# Patient Record
Sex: Male | Born: 2000
Health system: Southern US, Community
[De-identification: ages and names within clinical notes are randomized; demographics above are authoritative.]

## PROBLEM LIST (undated history)

## (undated) DIAGNOSIS — H9190 Unspecified hearing loss, unspecified ear: Secondary | ICD-10-CM

## (undated) DIAGNOSIS — F32A Depression, unspecified: Secondary | ICD-10-CM

## (undated) DIAGNOSIS — F419 Anxiety disorder, unspecified: Secondary | ICD-10-CM

## (undated) DIAGNOSIS — T7840XA Allergy, unspecified, initial encounter: Secondary | ICD-10-CM

## (undated) HISTORY — PX: BRAIN SURGERY: SHX531

## (undated) HISTORY — DX: Depression, unspecified: F32.A

## (undated) HISTORY — PX: INNER EAR SURGERY: SHX679

## (undated) HISTORY — PX: TONSILLECTOMY AND ADENOIDECTOMY: SUR1326

## (undated) HISTORY — DX: Anxiety disorder, unspecified: F41.9

## (undated) HISTORY — DX: Allergy, unspecified, initial encounter: T78.40XA

---
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
## 2022-03-10 ENCOUNTER — Other Ambulatory Visit: Payer: Self-pay | Admitting: Pediatrics

## 2022-03-10 DIAGNOSIS — L210 Seborrhea capitis: Secondary | ICD-10-CM

## 2022-04-24 ENCOUNTER — Telehealth: Payer: Self-pay | Admitting: Pediatrics

## 2022-04-24 DIAGNOSIS — L210 Seborrhea capitis: Secondary | ICD-10-CM

## 2022-04-24 MED ORDER — KETOCONAZOLE 2 % EX SHAM: MEDICATED_SHAMPOO | CUTANEOUS | 1 refills | Status: AC

## 2022-04-24 NOTE — Telephone Encounter (Signed)
ketoconazole (NIZORAL) 2 % shampoo

## 2022-05-13 DIAGNOSIS — R070 Pain in throat: Secondary | ICD-10-CM | POA: Diagnosis not present

## 2022-05-13 DIAGNOSIS — J069 Acute upper respiratory infection, unspecified: Secondary | ICD-10-CM | POA: Diagnosis not present

## 2022-05-14 DIAGNOSIS — S93402A Sprain of unspecified ligament of left ankle, initial encounter: Secondary | ICD-10-CM | POA: Diagnosis not present

## 2022-12-20 DIAGNOSIS — R070 Pain in throat: Secondary | ICD-10-CM | POA: Diagnosis not present

## 2022-12-20 DIAGNOSIS — Z20822 Contact with and (suspected) exposure to covid-19: Secondary | ICD-10-CM | POA: Diagnosis not present

## 2022-12-20 DIAGNOSIS — U071 COVID-19: Secondary | ICD-10-CM | POA: Diagnosis not present

## 2023-04-30 DIAGNOSIS — H5213 Myopia, bilateral: Secondary | ICD-10-CM | POA: Diagnosis not present

## 2023-05-18 DIAGNOSIS — M79651 Pain in right thigh: Secondary | ICD-10-CM | POA: Diagnosis not present

## 2023-08-15 DIAGNOSIS — K5900 Constipation, unspecified: Secondary | ICD-10-CM | POA: Diagnosis not present

## 2023-08-15 DIAGNOSIS — R1110 Vomiting, unspecified: Secondary | ICD-10-CM | POA: Diagnosis not present
